# Patient Record
Sex: Female | Born: 1985 | Race: White | Hispanic: No | Marital: Married | Smoking: Current every day smoker
Health system: Southern US, Community
[De-identification: ages and names within clinical notes are randomized; demographics above are authoritative.]

## PROBLEM LIST (undated history)

## (undated) DIAGNOSIS — E785 Hyperlipidemia, unspecified: Secondary | ICD-10-CM

## (undated) DIAGNOSIS — I517 Cardiomegaly: Secondary | ICD-10-CM

## (undated) DIAGNOSIS — R000 Tachycardia, unspecified: Secondary | ICD-10-CM

## (undated) DIAGNOSIS — I493 Ventricular premature depolarization: Secondary | ICD-10-CM

## (undated) HISTORY — DX: Ventricular premature depolarization: I49.3

## (undated) HISTORY — PX: WISDOM TOOTH EXTRACTION: SHX21

## (undated) HISTORY — PX: HERNIA REPAIR: SHX51

## (undated) HISTORY — PX: TONSILLECTOMY: SUR1361

---
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
## 2005-02-16 ENCOUNTER — Ambulatory Visit (HOSPITAL_COMMUNITY): Admission: RE | Admit: 2005-02-16 | Discharge: 2005-02-16 | Payer: Self-pay | Admitting: Obstetrics and Gynecology

## 2006-05-07 ENCOUNTER — Emergency Department (HOSPITAL_COMMUNITY): Admission: EM | Admit: 2006-05-07 | Discharge: 2006-05-07 | Payer: Self-pay | Admitting: Emergency Medicine

## 2014-07-12 ENCOUNTER — Emergency Department (HOSPITAL_COMMUNITY)
Admission: EM | Admit: 2014-07-12 | Discharge: 2014-07-12 | Disposition: A | Discharge status: Home / Self Care | Payer: No Typology Code available for payment source | Attending: Emergency Medicine | Admitting: Emergency Medicine

## 2014-07-12 ENCOUNTER — Encounter (HOSPITAL_COMMUNITY): Payer: Self-pay | Admitting: *Deleted

## 2014-07-12 DIAGNOSIS — Z792 Long term (current) use of antibiotics: Secondary | ICD-10-CM | POA: Insufficient documentation

## 2014-07-12 DIAGNOSIS — Z720 Tobacco use: Secondary | ICD-10-CM | POA: Insufficient documentation

## 2014-07-12 DIAGNOSIS — Z880 Allergy status to penicillin: Secondary | ICD-10-CM | POA: Diagnosis not present

## 2014-07-12 DIAGNOSIS — Z79899 Other long term (current) drug therapy: Secondary | ICD-10-CM | POA: Insufficient documentation

## 2014-07-12 DIAGNOSIS — K047 Periapical abscess without sinus: Secondary | ICD-10-CM

## 2014-07-12 DIAGNOSIS — K029 Dental caries, unspecified: Secondary | ICD-10-CM | POA: Diagnosis not present

## 2014-07-12 DIAGNOSIS — Z8679 Personal history of other diseases of the circulatory system: Secondary | ICD-10-CM | POA: Insufficient documentation

## 2014-07-12 DIAGNOSIS — K088 Other specified disorders of teeth and supporting structures: Secondary | ICD-10-CM | POA: Diagnosis present

## 2014-07-12 DIAGNOSIS — E785 Hyperlipidemia, unspecified: Secondary | ICD-10-CM | POA: Insufficient documentation

## 2014-07-12 HISTORY — DX: Cardiomegaly: I51.7

## 2014-07-12 HISTORY — DX: Hyperlipidemia, unspecified: E78.5

## 2014-07-12 HISTORY — DX: Tachycardia, unspecified: R00.0

## 2014-07-12 MED ORDER — IBUPROFEN 800 MG PO TABS
800.0000 mg | ORAL_TABLET | Freq: Once | ORAL | Status: AC
  Administered 2014-07-12: 800 mg via ORAL
  Filled 2014-07-12: qty 1

## 2014-07-12 MED ORDER — CLINDAMYCIN PHOSPHATE 600 MG/50ML IV SOLN
600.0000 mg | Freq: Once | INTRAVENOUS | Status: AC
  Administered 2014-07-12: 600 mg via INTRAVENOUS
  Filled 2014-07-12: qty 50

## 2014-07-12 NOTE — ED Notes (Signed)
Pt reports being treated on clindamycin, woke this morning with right side of face swollen

## 2014-07-12 NOTE — Discharge Instructions (Signed)

## 2014-07-12 NOTE — ED Provider Notes (Signed)
CSN: [id number]     Arrival date & time 07/12/14  0551 History   First MD Initiated Contact with Patient 07/12/14 [phone]     Chief Complaint  Patient presents with  . Dental Pain    Patient is a 29 y.o. female presenting with tooth pain. The history is provided by the patient.  Dental Pain Location:  Lower Severity:  Moderate Onset quality:  Gradual Duration:  3 days Timing:  Constant Progression:  Worsening Chronicity:  New Relieved by:  Nothing Worsened by:  Jaw movement and pressure Associated symptoms: facial pain and facial swelling   Associated symptoms: no difficulty swallowing, no fever and no neck pain   Risk factors: no diabetes   Pt saw dentist about 3 days ago for dental pain She was given clindamycin  QID.   This morning she woke up with worsened dental pain and facial swelling She reports h/o this previously and has required IV clindamycin She has f/u oral surgeon later this month   Past Medical History  Diagnosis Date  . Tachycardia   . Enlarged heart   . Hyperlipemia    Past Surgical History  Procedure Laterality Date  . Tonsillectomy    . Hernia repair    . Wisdom tooth extraction     No family history on file. History  Substance Use Topics  . Smoking status: Current Every Day Smoker    Types: Cigarettes  . Smokeless tobacco: Not on file  . Alcohol Use: No   OB History    No data available     Review of Systems  Constitutional: Negative for fever.  HENT: Positive for facial swelling. Negative for trouble swallowing.   Cardiovascular: Negative for chest pain.  Gastrointestinal: Negative for vomiting.  Musculoskeletal: Negative for neck pain.  Neurological: Negative for weakness.  All other systems reviewed and are negative.     Allergies  Penicillins  Home Medications   Prior to Admission medications   Medication Sig Start Date End Date Taking? Authorizing Provider  atorvastatin (LIPITOR) 20 MG tablet Take 20 mg by mouth daily.    Yes Historical Provider, MD  clindamycin (CLEOCIN) 300 MG capsule Take 300 mg by mouth 4 (four) times daily.   Yes Historical Provider, MD  norethindrone-ethinyl estradiol (JUNEL FE,GILDESS FE,LOESTRIN FE) 1-20 MG-MCG tablet Take 1 tablet by mouth daily.   Yes Historical Provider, MD   BP 136/96 mmHg  Pulse 93  Temp(Src) 98.2 F (36.8 C) (Oral)  Resp 20  Ht  (1.6 m)  Wt 199 lb (90.266 kg)  BMI 35.26 kg/m2  SpO2 100%  LMP 07/05/2014 Physical Exam CONSTITUTIONAL: Well developed/well nourished HEAD: Normocephalic/atraumatic EYES: EOMI ENMT: Mucous membranes moist. Mild swelling to right lower face.  No signs of cellulitis or abscess.  She has tenderness to right lower gingiva but no focal abscess.  Poor dentition is noted. No stridor.  No drooling.  Normal phonation NECK: supple no meningeal signs SPINE/BACK:entire spine nontender CV: S1/S2 noted, no murmurs/rubs/gallops noted LUNGS:  no apparent distress ABDOMEN: soft, nontender, no rebound or guarding, bowel sounds noted throughout abdomen NEURO: Pt is awake/alert/appropriate, moves all extremitiesx4.     EXTREMITIES: pulses normal/equal, full ROM SKIN: warm, color normal    ED Course  Procedures    Medications  clindamycin (CLEOCIN) IVPB 600 mg (600 mg Intravenous New Bag/Given 07/12/14 0635)  ibuprofen (ADVIL,MOTRIN) tablet 800 mg (800 mg Oral Given 07/12/14 0629)   6:40 AM Pt well appearing.  Suspect early dental abscess.  She is nontoxic I don't feel emergent imaging required She reports when this happened previously it responded to IV clinda (she has severe PCN allergy) She has f/u arranged  Will give IV clindamycin here then d/c home  she understands need for definitive management by dental extraction MDM   Final diagnoses:  Dental abscess    Nursing notes including past medical history and social history reviewed and considered in documentation     Zadie Rhineonald Deverick Pruss, MD 07/12/14 [phone]

## 2016-06-16 ENCOUNTER — Emergency Department (HOSPITAL_COMMUNITY)
Admission: EM | Admit: 2016-06-16 | Discharge: 2016-06-16 | Disposition: A | Discharge status: Home / Self Care | Payer: 59 | Attending: Emergency Medicine | Admitting: Emergency Medicine

## 2016-06-16 ENCOUNTER — Encounter (HOSPITAL_COMMUNITY): Payer: Self-pay | Admitting: Emergency Medicine

## 2016-06-16 ENCOUNTER — Emergency Department (HOSPITAL_COMMUNITY): Payer: 59

## 2016-06-16 DIAGNOSIS — R42 Dizziness and giddiness: Secondary | ICD-10-CM | POA: Diagnosis not present

## 2016-06-16 DIAGNOSIS — R002 Palpitations: Secondary | ICD-10-CM | POA: Insufficient documentation

## 2016-06-16 DIAGNOSIS — R0602 Shortness of breath: Secondary | ICD-10-CM | POA: Insufficient documentation

## 2016-06-16 DIAGNOSIS — F17210 Nicotine dependence, cigarettes, uncomplicated: Secondary | ICD-10-CM | POA: Diagnosis not present

## 2016-06-16 LAB — CBC WITH DIFFERENTIAL/PLATELET
Basophils Absolute: 0.1 10*3/uL (ref 0.0–0.1)
Basophils Relative: 1 %
EOS PCT: 1 %
Eosinophils Absolute: 0.1 10*3/uL (ref 0.0–0.7)
HEMATOCRIT: 40 % (ref 36.0–46.0)
Hemoglobin: 14.4 g/dL (ref 12.0–15.0)
LYMPHS ABS: 3.3 10*3/uL (ref 0.7–4.0)
LYMPHS PCT: 26 %
MCH: 31.5 pg (ref 26.0–34.0)
MCHC: 36 g/dL (ref 30.0–36.0)
MCV: 87.5 fL (ref 78.0–100.0)
MONO ABS: 1 10*3/uL (ref 0.1–1.0)
Monocytes Relative: 8 %
Neutro Abs: 8 10*3/uL — ABNORMAL HIGH (ref 1.7–7.7)
Neutrophils Relative %: 64 %
PLATELETS: 394 10*3/uL (ref 150–400)
RBC: 4.57 MIL/uL (ref 3.87–5.11)
RDW: 12.6 % (ref 11.5–15.5)
WBC: 12.5 10*3/uL — AB (ref 4.0–10.5)

## 2016-06-16 LAB — COMPREHENSIVE METABOLIC PANEL
ALT: 17 U/L (ref 14–54)
AST: 21 U/L (ref 15–41)
Albumin: 4.1 g/dL (ref 3.5–5.0)
Alkaline Phosphatase: 45 U/L (ref 38–126)
Anion gap: 8 (ref 5–15)
BUN: 15 mg/dL (ref 6–20)
CHLORIDE: 104 mmol/L (ref 101–111)
CO2: 24 mmol/L (ref 22–32)
CREATININE: 0.78 mg/dL (ref 0.44–1.00)
Calcium: 9.1 mg/dL (ref 8.9–10.3)
Glucose, Bld: 116 mg/dL — ABNORMAL HIGH (ref 65–99)
POTASSIUM: 3.7 mmol/L (ref 3.5–5.1)
Sodium: 136 mmol/L (ref 135–145)
TOTAL PROTEIN: 7 g/dL (ref 6.5–8.1)
Total Bilirubin: 0.6 mg/dL (ref 0.3–1.2)

## 2016-06-16 LAB — TROPONIN I

## 2016-06-16 LAB — I-STAT BETA HCG BLOOD, ED (MC, WL, AP ONLY): I-stat hCG, quantitative: <5 m[IU]/mL (ref <5)

## 2016-06-16 LAB — D-DIMER, QUANTITATIVE (NOT AT ARMC)

## 2016-06-16 MED ORDER — ATENOLOL 25 MG PO TABS: ORAL_TABLET | ORAL | 0 refills | Status: DC

## 2016-06-16 MED ORDER — ATENOLOL 25 MG PO TABS
25.0000 mg | ORAL_TABLET | Freq: Once | ORAL | Status: AC
  Administered 2016-06-16: 25 mg via ORAL
  Filled 2016-06-16: qty 1

## 2016-06-16 NOTE — ED Triage Notes (Signed)
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  Dr Legal is cardiologist

## 2016-06-16 NOTE — Discharge Instructions (Signed)
Drink pain. Take 1 pill of the atenolol daily if palpitations continue. Follow-up with her doctor this week if needed

## 2016-06-16 NOTE — ED Provider Notes (Signed)
[facility]-EMERGENCY DEPT Provider Note   CSN: [id number] Arrival date & time: 06/16/16  2011   By signing my name below, I, Bobbie Stack, attest that this documentation has been prepared under the direction and in the presence of Bethann Berkshire, MD. Electronically Signed: Bobbie Stack, Scribe. 06/16/16. 8:42 PM. History   Chief Complaint Chief Complaint  Patient presents with  . Palpitations    x 3 days after cortisone injection    HPI Comments: Beth Herrera is a 31 y.o. female who presents to the Emergency Department complaining of palpitations for the past 3 days. The patient states that she had a cortisone injection 3 days ago for biceps tendonitis. The patient states that she began to have PVCs shortly after having the injection. The patient states that this typically occurs after having this injection but it has never lasted this long before. Today after the patient took a shower she began to feel lightheaded. She also reports difficulty breathing. The patient states that she was unable to "catch her breath" which is still present. She denies any pain. She denies hx of beta blockers. She states that she feels much better currently.  The history is provided by the patient. No language interpreter was used.  Palpitations   This is a new problem. The current episode started more than 2 days ago. The problem occurs constantly. The problem has been gradually worsening. The problem is associated with caffeine. Associated symptoms include irregular heartbeat and shortness of breath. Pertinent negatives include no chest pain, no abdominal pain, no headaches, no back pain and no cough. She has tried nothing for the symptoms.   Past Medical History:  Diagnosis Date  . Enlarged heart   . Hyperlipemia   . Tachycardia     There are no active problems to display for this patient.   Past Surgical History:  Procedure Laterality Date  . HERNIA REPAIR    . TONSILLECTOMY    . WISDOM  TOOTH EXTRACTION      OB History    No data available       Home Medications    Prior to Admission medications   Medication Sig Start Date End Date Taking? Authorizing Provider  atorvastatin (LIPITOR) 20 MG tablet Take 20 mg by mouth daily.    Historical Provider, MD  clindamycin (CLEOCIN) 300 MG capsule Take 300 mg by mouth 4 (four) times daily.    Historical Provider, MD  norethindrone-ethinyl estradiol (JUNEL FE,GILDESS FE,LOESTRIN FE) 1-20 MG-MCG tablet Take 1 tablet by mouth daily.    Historical Provider, MD    Family History No family history on file.  Social History Social History  Substance Use Topics  . Smoking status: Current Every Day Smoker    Packs/day: 0.50    Types: Cigarettes  . Smokeless tobacco: Never Used  . Alcohol use No     Allergies   Penicillins   Review of Systems Review of Systems  Constitutional: Negative for appetite change and fatigue.  HENT: Negative for congestion, ear discharge and sinus pressure.   Eyes: Negative for discharge.  Respiratory: Positive for shortness of breath. Negative for cough.   Cardiovascular: Positive for palpitations. Negative for chest pain.  Gastrointestinal: Negative for abdominal pain and diarrhea.  Genitourinary: Negative for frequency and hematuria.  Musculoskeletal: Negative for back pain.  Skin: Negative for rash.  Neurological: Positive for light-headedness. Negative for seizures and headaches.  Psychiatric/Behavioral: Negative for hallucinations.  All other systems reviewed and are negative.    Physical  Exam Updated Vital Signs BP (!) 150/93 (BP Location: Left Arm)   Pulse 87   Temp 97.9 F (36.6 C) (Oral)   Resp 18   Ht 5\' 3"  (1.6 m)   Wt 200 lb (90.7 kg)   LMP 05/17/2016   SpO2 100%   BMI 35.43 kg/m   Physical Exam  Constitutional: She is oriented to person, place, and time. She appears well-developed.  HENT:  Head: Normocephalic.  Eyes: Conjunctivae and EOM are normal. No scleral  icterus.  Neck: Neck supple. No thyromegaly present.  Cardiovascular: Normal rate.  An irregular rhythm present. Exam reveals no gallop and no friction rub.   No murmur heard. Pulmonary/Chest: No stridor. She has no wheezes. She has no rales. She exhibits no tenderness.  Abdominal: She exhibits no distension. There is no tenderness. There is no rebound.  Musculoskeletal: Normal range of motion. She exhibits no edema.  Lymphadenopathy:    She has no cervical adenopathy.  Neurological: She is oriented to person, place, and time. She exhibits normal muscle tone. Coordination normal.  Skin: No rash noted. No erythema.  Psychiatric: She has a normal mood and affect. Her behavior is normal.  Nursing note and vitals reviewed.    ED Treatments / Results  DIAGNOSTIC STUDIES: Oxygen Saturation is 100% on RA, normal by my interpretation.    COORDINATION OF CARE: 8:35 PM Discussed treatment plan with pt at bedside and pt agreed to plan. I will check the patients EKG and labs.  Labs (all labs ordered are listed, but only abnormal results are displayed) Labs Reviewed  CBC WITH DIFFERENTIAL/PLATELET  COMPREHENSIVE METABOLIC PANEL  TROPONIN I  D-DIMER, QUANTITATIVE (NOT AT [facility])  I-STAT BETA HCG BLOOD, ED ([facility], [facility], [facility] ONLY)    EKG  EKG Interpretation None       Radiology No results found.  Procedures Procedures (including critical care time)  Medications Ordered in ED Medications - No data to display   Initial Impression / Assessment and Plan / ED Course  I have reviewed the triage vital signs and the nursing notes.  Pertinent labs & imaging results that were available during my care of the patient were reviewed by me and considered in my medical decision making (see chart for details).    Patient with unifocal PVCs.. The PVCs are infrequent. They do seem to bother the patient. Labs unremarkable. Patient given some atenolol to take if needed for PVCs she will follow-up her  PCP  Final Clinical Impressions(s) / ED Diagnoses   Final diagnoses:  None    New Prescriptions New Prescriptions   No medications on file   The chart was scribed for me under my direct supervision.  I personally performed the history, physical, and medical decision making and all procedures in the evaluation of this patient.Bethann Berkshire.    Theron Cumbie, MD 06/16/16 2230

## 2016-06-24 NOTE — Progress Notes (Deleted)
Cardiology Office Note   Date:  06/24/2016   ID:  OTA EBERSOLE, DOB 1985/06/03, MRN [id number]  PCP:  Truddie Coco, FNP  Cardiologist:   Charlton Haws, MD   No chief complaint on file.     History of Present Illness: Beth Herrera is a 31 y.o. female who presents for evaluation of palpitations Referred by Dr Estell Harpin seen in ER 06/16/16  This coincided with cortisone injection of biceps tendonitis. She has had palpitations after injections before but would go away and these were lasting 3 days. Took shower 3/24 and felt dizzy. Also some dyspnea In ER Noted to have unifocal PVCls in ER given atenolol with some improvement  Labs from ER visit reviewed r/o d dimer negative no TSH done K 3.7 Hct 40  CXR  NAD    Past Medical History:  Diagnosis Date  . Enlarged heart   . Hyperlipemia   . Tachycardia     Past Surgical History:  Procedure Laterality Date  . HERNIA REPAIR    . TONSILLECTOMY    . WISDOM TOOTH EXTRACTION       Current Outpatient Prescriptions  Medication Sig Dispense Refill  . atenolol (TENORMIN) 25 MG tablet Take one pill a day if needed for palpitaions 15 tablet 0  . atorvastatin (LIPITOR) 20 MG tablet Take 20 mg by mouth daily.    . clindamycin (CLEOCIN) 300 MG capsule Take 300 mg by mouth 4 (four) times daily.    . norethindrone-ethinyl estradiol (JUNEL FE,GILDESS FE,LOESTRIN FE) 1-20 MG-MCG tablet Take 1 tablet by mouth daily.     No current facility-administered medications for this visit.     Allergies:   Penicillins    Social History:  The patient  reports that she has been smoking Cigarettes.  She has been smoking about 0.50 packs per day. She has never used smokeless tobacco. She reports that she does not drink alcohol or use drugs.   Family History:  The patient's family history is not on file.    ROS:  Please see the history of present illness.   Otherwise, review of systems are positive for none.   All other systems are reviewed and  negative.    PHYSICAL EXAM: VS:  There were no vitals taken for this visit. , BMI There is no height or weight on file to calculate BMI. Affect appropriate Healthy:  appears stated age HEENT: normal Neck supple with no adenopathy JVP normal no bruits no thyromegaly Lungs clear with no wheezing and good diaphragmatic motion Heart:  S1/S2 no murmur, no rub, gallop or click PMI normal Abdomen: benighn, BS positve, no tenderness, no AAA no bruit.  No HSM or HJR Distal pulses intact with no bruits No edema Neuro non-focal Skin warm and dry No muscular weakness    EKG:  SR LAE normal 06/18/16    Recent Labs: 06/16/2016: ALT 17; BUN 15; Creatinine, Ser 0.78; Hemoglobin 14.4; Platelets 394; Potassium 3.7; Sodium 136    Lipid Panel No results found for: CHOL, TRIG, HDL, CHOLHDL, VLDL, LDLCALC, LDLDIRECT    Wt Readings from Last 3 Encounters:  06/16/16 200 lb (90.7 kg)  07/12/14 199 lb (90.3 kg)      Other studies Reviewed: Additional studies/ records that were reviewed today include: Notes from ER , labs ECG.    ASSESSMENT AND PLAN:  1.  Palpitations/PVC;s : 2.  Cholesterol:     Current medicines are reviewed at length with the patient today.  The patient does  not have concerns regarding medicines.  The following changes have been made:  ***  Labs/ tests ordered today include: Exercise Treadmill and Echo   No orders of the defined types were placed in this encounter.    Disposition:   FU with me after testing      Signed, Charlton Haws, MD  06/24/2016 2:10 PM    [facility] [address] [place], [place], [place]  [address] Phone: [phone]; Fax: [phone]

## 2016-06-25 ENCOUNTER — Encounter: Payer: PRIVATE HEALTH INSURANCE | Admitting: Cardiovascular Disease

## 2018-08-08 ENCOUNTER — Other Ambulatory Visit: Payer: Self-pay

## 2018-08-08 ENCOUNTER — Encounter (HOSPITAL_COMMUNITY): Payer: Self-pay | Admitting: *Deleted

## 2018-08-08 ENCOUNTER — Emergency Department (HOSPITAL_COMMUNITY)
Admission: EM | Admit: 2018-08-08 | Discharge: 2018-08-08 | Disposition: A | Discharge status: Home / Self Care | Payer: No Typology Code available for payment source | Attending: Emergency Medicine | Admitting: Emergency Medicine

## 2018-08-08 ENCOUNTER — Emergency Department (HOSPITAL_COMMUNITY): Payer: No Typology Code available for payment source

## 2018-08-08 DIAGNOSIS — S92034A Nondisplaced avulsion fracture of tuberosity of right calcaneus, initial encounter for closed fracture: Secondary | ICD-10-CM | POA: Insufficient documentation

## 2018-08-08 DIAGNOSIS — W010XXA Fall on same level from slipping, tripping and stumbling without subsequent striking against object, initial encounter: Secondary | ICD-10-CM | POA: Diagnosis not present

## 2018-08-08 DIAGNOSIS — Y999 Unspecified external cause status: Secondary | ICD-10-CM | POA: Diagnosis not present

## 2018-08-08 DIAGNOSIS — F17210 Nicotine dependence, cigarettes, uncomplicated: Secondary | ICD-10-CM | POA: Diagnosis not present

## 2018-08-08 DIAGNOSIS — Z79899 Other long term (current) drug therapy: Secondary | ICD-10-CM | POA: Diagnosis not present

## 2018-08-08 DIAGNOSIS — S99921A Unspecified injury of right foot, initial encounter: Secondary | ICD-10-CM | POA: Diagnosis present

## 2018-08-08 DIAGNOSIS — Y929 Unspecified place or not applicable: Secondary | ICD-10-CM | POA: Diagnosis not present

## 2018-08-08 DIAGNOSIS — Y939 Activity, unspecified: Secondary | ICD-10-CM | POA: Diagnosis not present

## 2018-08-08 NOTE — ED Provider Notes (Signed)
[facility] EMERGENCY DEPARTMENT Provider Note   CSN: [id number] Arrival date & time: 08/08/18  1900    History   Chief Complaint Chief Complaint  Patient presents with  . Fall    HPI Beth Herrera is a 33 y.o. female.     The history is provided by the patient. No language interpreter was used.  Fall  This is a new problem. The current episode started 3 to 5 hours ago. The problem occurs constantly. The problem has been gradually worsening. Nothing aggravates the symptoms. Nothing relieves the symptoms. She has tried nothing for the symptoms. The treatment provided no relief.  Pt reports she took a step and her foot was asleep.  Pt reports she tripped and fell.  Pt complains of pain in her foot and ankle.   Past Medical History:  Diagnosis Date  . Enlarged heart   . Hyperlipemia   . Tachycardia     There are no active problems to display for this patient.   Past Surgical History:  Procedure Laterality Date  . HERNIA REPAIR    . TONSILLECTOMY    . WISDOM TOOTH EXTRACTION       OB History   No obstetric history on file.      Home Medications    Prior to Admission medications   Medication Sig Start Date End Date Taking? Authorizing Provider  atenolol (TENORMIN) 25 MG tablet Take one pill a day if needed for palpitaions 06/16/16   Bethann BerkshireZammit, Joseph, MD  atorvastatin (LIPITOR) 20 MG tablet Take 20 mg by mouth daily.    [provider]  clindamycin (CLEOCIN) 300 MG capsule Take 300 mg by mouth 4 (four) times daily.    [provider]  norethindrone-ethinyl estradiol (JUNEL FE,GILDESS FE,LOESTRIN FE) 1-20 MG-MCG tablet Take 1 tablet by mouth daily.    [provider]    Family History No family history on file.  Social History Social History   Tobacco Use  . Smoking status: Current Every Day Smoker    Packs/day: 0.50    Types: Cigarettes  . Smokeless tobacco: Never Used  Substance Use Topics  . Alcohol use: No  . Drug use: No      Allergies   Penicillins   Review of Systems Review of Systems  Musculoskeletal: Positive for arthralgias.  All other systems reviewed and are negative.    Physical Exam Updated Vital Signs BP 137/76   Pulse 91   Temp 98.1 F (36.7 C)   Resp 16   Ht 5\' 3"  (1.6 m)   Wt 104.3 kg   LMP 08/07/2018   SpO2 100%   BMI 40.74 kg/m   Physical Exam Vitals signs reviewed.  HENT:     Head: Normocephalic.  Musculoskeletal:        General: Swelling, tenderness and signs of injury present.  Skin:    General: Skin is warm.  Neurological:     General: No focal deficit present.     Mental Status: She is alert.  Psychiatric:        Mood and Affect: Mood normal.      ED Treatments / Results  Labs (all labs ordered are listed, but only abnormal results are displayed) Labs Reviewed - No data to display  EKG None  Radiology Dg Ankle Complete Right  Result Date: 08/08/2018 CLINICAL DATA:  Tripped over dog leading to fall. Right foot and ankle pain. EXAM: RIGHT ANKLE - COMPLETE 3+ VIEW COMPARISON:  None. FINDINGS: Acute avulsion  fracture from the anterolateral process of the calcaneus. Associated soft tissue edema. No additional fracture. The ankle mortise is preserved. There is a plantar calcaneal spur. Soft tissue edema most prominent laterally. No tibial talar joint effusion. IMPRESSION: Acute avulsion fracture from the anterolateral process of the calcaneus. Electronically Signed   By: Narda Rutherford M.D.   On: 08/08/2018 20:17   Dg Foot Complete Right  Result Date: 08/08/2018 CLINICAL DATA:  Tripped over dog leading to fall. Right foot and ankle pain. EXAM: RIGHT FOOT COMPLETE - 3+ VIEW COMPARISON:  None. FINDINGS: Acute avulsion fracture from the anterolateral process of the calcaneus. No additional fracture of the foot. The remaining alignment and joint spaces are maintained. Prominent plantar calcaneal spur. Lateral soft tissue edema. IMPRESSION: Acute avulsion  fracture from the anterolateral process of the calcaneus. Electronically Signed   By: Narda Rutherford M.D.   On: 08/08/2018 20:18    Procedures Procedures (including critical care time)  Medications Ordered in ED Medications - No data to display   Initial Impression / Assessment and Plan / ED Course  I have reviewed the triage vital signs and the nursing notes.  Pertinent labs & imaging results that were available during my care of the patient were reviewed by me and considered in my medical decision making (see chart for details).        MDM  Pt has an avulsion fracture to calcaneous.  Pt placed in a cam walker and given crutches.  Pt advised to follow up with Dr. Romeo Apple for recheck.    Final Clinical Impressions(s) / ED Diagnoses   Final diagnoses:  Closed nondisplaced avulsion fracture of tuberosity of right calcaneus, initial encounter    ED Discharge Orders    None    An After Visit Summary was printed and given to the patient.    Osie Cheeks 08/08/18 2039    Gerhard Munch, MD 08/08/18 2257

## 2018-08-08 NOTE — ED Triage Notes (Addendum)
Pt tripped over her dog today, c/o right ankle and foot pain, positive pedal pulse noted, ice pack applied, denies any other injury, denies any LOC<

## 2018-08-08 NOTE — Discharge Instructions (Addendum)
Return if any problems. Schedule to see the Orthopaedist for evaluation next week  

## 2018-11-10 ENCOUNTER — Emergency Department (HOSPITAL_COMMUNITY)
Admission: EM | Admit: 2018-11-10 | Discharge: 2018-11-10 | Disposition: A | Discharge status: Home / Self Care | Payer: No Typology Code available for payment source | Attending: Emergency Medicine | Admitting: Emergency Medicine

## 2018-11-10 ENCOUNTER — Emergency Department (HOSPITAL_COMMUNITY): Payer: No Typology Code available for payment source

## 2018-11-10 ENCOUNTER — Encounter (HOSPITAL_COMMUNITY): Payer: Self-pay | Admitting: Emergency Medicine

## 2018-11-10 ENCOUNTER — Other Ambulatory Visit: Payer: Self-pay

## 2018-11-10 DIAGNOSIS — F17210 Nicotine dependence, cigarettes, uncomplicated: Secondary | ICD-10-CM | POA: Insufficient documentation

## 2018-11-10 DIAGNOSIS — Z79899 Other long term (current) drug therapy: Secondary | ICD-10-CM | POA: Diagnosis not present

## 2018-11-10 DIAGNOSIS — R002 Palpitations: Secondary | ICD-10-CM | POA: Diagnosis not present

## 2018-11-10 LAB — COMPREHENSIVE METABOLIC PANEL
ALT: 20 U/L (ref 0–44)
AST: 17 U/L (ref 15–41)
Albumin: 4.6 g/dL (ref 3.5–5.0)
Alkaline Phosphatase: 67 U/L (ref 38–126)
Anion gap: 11 (ref 5–15)
BUN: 10 mg/dL (ref 6–20)
CO2: 22 mmol/L (ref 22–32)
Calcium: 9.7 mg/dL (ref 8.9–10.3)
Chloride: 103 mmol/L (ref 98–111)
Creatinine, Ser: 0.78 mg/dL (ref 0.44–1.00)
GFR calc Af Amer: >60 mL/min (ref >60)
GFR calc non Af Amer: >60 mL/min (ref >60)
Glucose, Bld: 103 mg/dL — ABNORMAL HIGH (ref 70–99)
Potassium: 3.6 mmol/L (ref 3.5–5.1)
Sodium: 136 mmol/L (ref 135–145)
Total Bilirubin: 0.7 mg/dL (ref 0.3–1.2)
Total Protein: 8 g/dL (ref 6.5–8.1)

## 2018-11-10 LAB — CBC WITH DIFFERENTIAL/PLATELET
Abs Immature Granulocytes: 0.03 10*3/uL (ref 0.00–0.07)
Basophils Absolute: 0.1 10*3/uL (ref 0.0–0.1)
Basophils Relative: 1 %
Eosinophils Absolute: 0.1 10*3/uL (ref 0.0–0.5)
Eosinophils Relative: 2 %
HCT: 46.2 % — ABNORMAL HIGH (ref 36.0–46.0)
Hemoglobin: 15.6 g/dL — ABNORMAL HIGH (ref 12.0–15.0)
Immature Granulocytes: 0 %
Lymphocytes Relative: 31 %
Lymphs Abs: 2.9 10*3/uL (ref 0.7–4.0)
MCH: 29.4 pg (ref 26.0–34.0)
MCHC: 33.8 g/dL (ref 30.0–36.0)
MCV: 87 fL (ref 80.0–100.0)
Monocytes Absolute: 0.7 10*3/uL (ref 0.1–1.0)
Monocytes Relative: 7 %
Neutro Abs: 5.5 10*3/uL (ref 1.7–7.7)
Neutrophils Relative %: 59 %
Platelets: 417 10*3/uL — ABNORMAL HIGH (ref 150–400)
RBC: 5.31 MIL/uL — ABNORMAL HIGH (ref 3.87–5.11)
RDW: 12.9 % (ref 11.5–15.5)
WBC: 9.3 10*3/uL (ref 4.0–10.5)
nRBC: 0 % (ref 0.0–0.2)

## 2018-11-10 LAB — TSH: TSH: 3.582 u[IU]/mL (ref 0.350–4.500)

## 2018-11-10 LAB — LIPASE, BLOOD: Lipase: 46 U/L (ref 11–51)

## 2018-11-10 MED ORDER — METOPROLOL TARTRATE 50 MG PO TABS
50.0000 mg | ORAL_TABLET | Freq: Once | ORAL | Status: AC
  Administered 2018-11-10: 50 mg via ORAL
  Filled 2018-11-10: qty 1

## 2018-11-10 MED ORDER — METOPROLOL TARTRATE 25 MG PO TABS: 25.0000 mg | ORAL_TABLET | Freq: Two times a day (BID) | ORAL | 0 refills | Status: DC | PRN

## 2018-11-10 MED ORDER — LORAZEPAM 1 MG PO TABS
1.0000 mg | ORAL_TABLET | Freq: Once | ORAL | Status: AC
  Administered 2018-11-10: 15:00:00 1 mg via ORAL
  Filled 2018-11-10: qty 1

## 2018-11-10 NOTE — ED Notes (Signed)
EKG given to Dr. Miller.  

## 2018-11-10 NOTE — ED Provider Notes (Signed)
Patient presents for evaluation of palpitations.  Checkout from Dr. Sabra Heck to evaluate after labs complete.  TSH, is normal.  Dr. Ammie Ferrier note reviewed.  She has been treated with Ativan and metoprolol.  The metoprolol was given at 1444.   Patient Vitals for the past 24 hrs:  BP Pulse Resp SpO2 Height Weight  11/10/18 1500 (!) 133/102 (!) 111 (!) 24 100 % - -  11/10/18 1430 (!) 122/92 82 10 99 % - -  11/10/18 1357 - - - - 5\' 3"  (1.6 m) 90.7 kg    3:48 PM Reevaluation with update and discussion. After initial assessment and treatment, an updated evaluation reveals patient is comfortable.  Cardiac monitor indicates heart rate 70 at this time.  She has mild hypertension.  Patient is comfortable and feels like she is ready to go home.  Findings discussed with patient and her husband, all questions were answered. Daleen Bo   Medical Decision Making: Palpitation without worrisome symptoms for PE, cardiac disorder or acute infectious process.  Patient with history of palpitations, previously treated for them with beta-blocker.  She was using it intermittently.  No recent cardiac evaluation.  Patient is clinically stable here in the ED.  She does not require additional evaluation.  She will benefit from follow-up with cardiology for outpatient testing likely to include long-term cardiac monitoring with event monitor.  Doubt acute infectious process, metabolic instability or impending vascular collapse.  CRITICAL CARE-no Performed by: Daleen Bo  Nursing Notes Reviewed/ Care Coordinated Applicable Imaging Reviewed Interpretation of Laboratory Data incorporated into ED treatment  The patient appears reasonably screened and/or stabilized for discharge and I doubt any other medical condition or other [facility] requiring further screening, evaluation, or treatment in the ED at this time prior to discharge.  Plan: Home Medications-start metoprolol 25 mg, twice daily, PRN.; Home Treatments-rest, fluids,  avoid cardiac stimulants.; return here if the recommended treatment, does not improve the symptoms; Recommended follow up-cardiology follow-up 1 to 2 weeks for further testing and treatment.    Daleen Bo, MD 11/10/18 [phone]

## 2018-11-10 NOTE — ED Provider Notes (Signed)
[facility] EMERGENCY DEPARTMENT Provider Note   CSN: [id number] Arrival date & time: 11/10/18  1348     History   Chief Complaint Chief Complaint  Patient presents with  . Chest Pain    HPI Beth Herrera is a 33 y.o. female.     HPI  This patient is a very pleasant 33 year old female who has no significant chronic medical conditions, she does have a history of coming to the hospital because of palpitations and PVCs in the past, she had seen a cardiologist about 5 years ago where she had a transesophageal echocardiogram and a stress test both of which were unremarkable.  There is a family history of atrial myxoma.  She reports that she has had intermittent palpitations over time the usually it is once or twice a day but nothing that serious.  Over the last 2 days she has had a feeling of vague discomfort in her chest which seem to radiate to her back today and was associated with some shortness of breath and palpitations.  At this time on my examination of the patient she states that she has just a subtle feeling in the left upper chest which is very common for her and is not exertional at all.  In fact she had been going to the gym before this coronavirus outbreak and was not having any symptoms like this while she was exercising.  She denies diarrhea, she had a recent urinary tract infection and took Cipro.  She reports running out of her atenolol which she had used as needed for palpitations and was given this a couple of years ago in the emergency department.  She has not had in quite some time.  At this time her symptoms are mild, not associated with fevers, chills, nausea vomiting or diarrhea or swelling of the legs.  She does drink some coffee, unaware of any thyroid dysfunction, she does take Allegra  Past Medical History:  Diagnosis Date  . Enlarged heart   . Hyperlipemia   . Tachycardia     There are no active problems to display for this patient.   Past Surgical  History:  Procedure Laterality Date  . HERNIA REPAIR    . TONSILLECTOMY    . WISDOM TOOTH EXTRACTION       OB History   No obstetric history on file.      Home Medications    Prior to Admission medications   Medication Sig Start Date End Date Taking? Authorizing Provider  atenolol (TENORMIN) 25 MG tablet Take one pill a day if needed for palpitaions 06/16/16   Milton Ferguson, MD  atorvastatin (LIPITOR) 20 MG tablet Take 20 mg by mouth daily.    [provider]  clindamycin (CLEOCIN) 300 MG capsule Take 300 mg by mouth 4 (four) times daily.    [provider]  norethindrone-ethinyl estradiol (JUNEL FE,GILDESS FE,LOESTRIN FE) 1-20 MG-MCG tablet Take 1 tablet by mouth daily.    [provider]    Family History No family history on file.  Social History Social History   Tobacco Use  . Smoking status: Current Every Day Smoker    Packs/day: 0.50    Types: Cigarettes  . Smokeless tobacco: Never Used  Substance Use Topics  . Alcohol use: No  . Drug use: No     Allergies   Penicillins   Review of Systems Review of Systems  All other systems reviewed and are negative.    Physical Exam Updated Vital Signs  Ht 1.6 m (5\' 3" )   Wt 90.7 kg   LMP 10/10/2018   BMI 35.43 kg/m   Physical Exam Vitals signs and nursing note reviewed.  Constitutional:      General: She is not in acute distress.    Appearance: She is well-developed.  HENT:     Head: Normocephalic and atraumatic.     Mouth/Throat:     Pharynx: No oropharyngeal exudate.  Eyes:     General: No scleral icterus.       Right eye: No discharge.        Left eye: No discharge.     Conjunctiva/sclera: Conjunctivae normal.     Pupils: Pupils are equal, round, and reactive to light.  Neck:     Musculoskeletal: Normal range of motion and neck supple.     Thyroid: No thyromegaly.     Vascular: No JVD.  Cardiovascular:     Rate and Rhythm: Normal rate and regular rhythm.   Extrasystoles are present.    Heart sounds: Normal heart sounds. No murmur. No friction rub. No gallop.   Pulmonary:     Effort: Pulmonary effort is normal. No respiratory distress.     Breath sounds: Normal breath sounds. No wheezing or rales.  Abdominal:     General: Bowel sounds are normal. There is no distension.     Palpations: Abdomen is soft. There is no mass.     Tenderness: There is no abdominal tenderness.  Musculoskeletal: Normal range of motion.        General: No tenderness.  Lymphadenopathy:     Cervical: No cervical adenopathy.  Skin:    General: Skin is warm and dry.     Findings: No erythema or rash.  Neurological:     Mental Status: She is alert.     Coordination: Coordination normal.  Psychiatric:        Behavior: Behavior normal.      ED Treatments / Results  Labs (all labs ordered are listed, but only abnormal results are displayed) Labs Reviewed  TSH  CBC WITH DIFFERENTIAL/PLATELET  COMPREHENSIVE METABOLIC PANEL  LIPASE, BLOOD    EKG EKG Interpretation  Date/Time:  Monday November 10 2018 14:07:14 EDT Ventricular Rate:  93 PR Interval:    QRS Duration: 88 QT Interval:  381 QTC Calculation: 393 R Axis:   97 Text Interpretation:  Sinus rhythm Ventricular bigeminy Anterior infarct, old Repol abnrm suggests ischemia, inferior leads Baseline wander in lead(s) I III aVL Since last tracing ectopy now present, otherwise unchanged Confirmed by Eber HongMiller, Aubrey Voong ([id number]) on 11/10/2018 2:10:22 PM   EKG Interpretation  Date/Time:  Monday November 10 2018 14:11:58 EDT Ventricular Rate:  159 PR Interval:    QRS Duration: 82 QT Interval:  303 QTC Calculation: 493 R Axis:   103 Text Interpretation:  Sinus tachycardia Ventricular premature complex Borderline prolonged PR interval Prominent P waves, nondiagnostic Anterolateral infarct, age indeterminate Abnormal T, consider ischemia, inferior leads Baseline wander in lead(s) II III aVF Confirmed by Eber HongMiller, Mauri Tolen  [id number]) on 11/10/2018 2:29:20 PM        EKG Interpretation  Date/Time:  Monday November 10 2018 14:17:48 EDT Ventricular Rate:  107 PR Interval:    QRS Duration: 76 QT Interval:  321 QTC Calculation: 429 R Axis:   106 Text Interpretation:  Sinus tachycardia Consider right atrial enlargement Anterolateral infarct, age indeterminate Since last tracing rate slower Confirmed by Eber HongMiller, Jasey Cortez ([id number]) on 11/10/2018 2:29:49 PM     ]  Radiology No results found.  Procedures Procedures (including critical care time)  Medications Ordered in ED Medications  metoprolol tartrate (LOPRESSOR) tablet 50 mg (has no administration in time range)     Initial Impression / Assessment and Plan / ED Course  I have reviewed the triage vital signs and the nursing notes.  Pertinent labs & imaging results that were available during my care of the patient were reviewed by me and considered in my medical decision making (see chart for details).       While I am examining the patient she goes from ventricular bigeminy back in a normal sinus rhythm.  She does not appear to have any SVT.  Her heart rate is between 90 and 110 and what appears to be sinus tachycardia and normal sinus rhythm.  She is not having any active shortness of breath, she is not coughing, does not feel swollen in the legs however given her increase in heart rate with palpitations which are very palpable for her I will check some labs including a thyroid panel as well as metabolic panel, CBC and an EKG.  The EKG reveals frequent ectopy, the patient then became very tachycardic at 159 and then slowed down to a normal sinus rhythm spontaneously.  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  I have encouraged the patient to stop smoking, she smokes 1 pack of cigarettes per day.  I have also encouraged her to stop her intake  of caffeine or other stimulants and she is agreeable.  Labs pending, the patient is stable appearing and not in need of cardioversion or adenosine at this time.  She will be given a small dose of metoprolol and likely started on this for a small daily dose.  Needs cardiology outpatient follow-up, this does not sound ischemic and she has no other risk factors for ischemia.  Final Clinical Impressions(s) / ED Diagnoses   Final diagnoses:  None    ED Discharge Orders    None       Eber HongMiller, Kristan Votta, MD 11/14/18 [phone]

## 2018-11-10 NOTE — ED Notes (Signed)
Pt has been hooked up to [employer].

## 2018-11-10 NOTE — ED Triage Notes (Signed)
Pt states that she has been having pain in her chest since yesterday it has moved into her back since yesterday.

## 2018-11-10 NOTE — Discharge Instructions (Addendum)
The testing today, is reassuring.  There is no sign of acute heart disorder, infections, thyroid disease or metabolic problems.  It will be helpful for you to have further evaluation by a cardiologist, to check your cardiac rhythm and rate, with a Holter monitor.  Call to schedule appointment.  In the meantime make sure you are getting plenty of rest, drinking a lot of fluids and eating 3 meals each day.

## 2018-11-21 ENCOUNTER — Other Ambulatory Visit: Payer: Self-pay

## 2018-11-21 ENCOUNTER — Ambulatory Visit (INDEPENDENT_AMBULATORY_CARE_PROVIDER_SITE_OTHER): Payer: No Typology Code available for payment source | Admitting: Cardiology

## 2018-11-21 ENCOUNTER — Encounter: Payer: Self-pay | Admitting: Cardiology

## 2018-11-21 ENCOUNTER — Telehealth: Payer: Self-pay | Admitting: Cardiology

## 2018-11-21 VITALS — BP 114/77 | HR 81 | Ht 63.0 in | Wt 226.0 lb

## 2018-11-21 DIAGNOSIS — R002 Palpitations: Secondary | ICD-10-CM

## 2018-11-21 DIAGNOSIS — I493 Ventricular premature depolarization: Secondary | ICD-10-CM

## 2018-11-21 MED ORDER — ATENOLOL 25 MG PO TABS: 25.0000 mg | ORAL_TABLET | Freq: Every day | ORAL | 3 refills | Status: AC | PRN

## 2018-11-21 NOTE — Telephone Encounter (Signed)
Pre-cert Verification for the following procedure    3 Day ZIO dx: frequent PVC's & palpitations

## 2018-11-21 NOTE — Patient Instructions (Addendum)
Medication Instructions:    Your physician has recommended you make the following change in your medication:   Stop metoprolol  Start atenolol 25 mg by mouth daily as needed for palpitations  Continue all other medications the same  Labwork:  NONE  Testing/Procedures: Your physician has requested that you have an echocardiogram. Echocardiography is a painless test that uses sound waves to create images of your heart. It provides your doctor with information about the size and shape of your heart and how well your heart's chambers and valves are working. This procedure takes approximately one hour. There are no restrictions for this procedure. Your physician has recommended that you wear a holter monitor for 3 days. Holter monitors are medical devices that record the heart's electrical activity. Doctors most often use these monitors to diagnose arrhythmias. Arrhythmias are problems with the speed or rhythm of the heartbeat. The monitor is a small, portable device. You can wear one while you do your normal daily activities. This is usually used to diagnose what is causing palpitations/syncope (passing out). [employer] (ZIO) will contact you about this monitor.  Follow-Up:  Your physician recommends that you schedule a follow-up appointment in: 3-4 months.  Any Other Special Instructions Will Be Listed Below (If Applicable).  If you need a refill on your cardiac medications before your next appointment, please call your pharmacy.

## 2018-11-21 NOTE — Progress Notes (Signed)
Cardiology Office Note  Date: 11/21/2018   ID: Beth AbrahamsStephanie M Herrera, DOB 06/14/1985, MRN [id number]  PCP:  Truddie CocoMcClure, Karen, FNP  Consulting Cardiologist:  Nona DellSamuel Rachael Ferrie, MD Electrophysiologist:  None   Chief Complaint  Patient presents with  . Palpitations    History of Present Illness: Beth Herrera is a 33 y.o. female referred for cardiology consultation by Dr. Effie ShyWentz for the evaluation of palpitations.  She was seen in the ER recently on August 17.  She describes a 5-year history of intermittent palpitations and documented PVCs that tend to come and flareups.  She describes prior documented "bigeminy."  She is symptomatic during these times, feels a sense of palpitations, sometimes breathlessness or discomfort in the chest.  She has never had frank syncope.  She may have had an episode of SVT as well, but details are not clear.  She reports a history of atrial myxoma in some family members, she actually had a TEE about 5 years ago that was reportedly normal.  No other known history of structural heart disease.  She was previously on atenolol which she took as needed for PVCs/palpitations.  She was started on metoprolol after her recent ER visit, but took 1 dose and actually felt worse on that medication.  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  She is currently on a diet, trying to lose weight.  She had gained about 50 pounds over the last 5 years.  Not exercising as much during the pandemic but is trying to start back.  She is also cutting back on caffeine.  Past Medical History:  Diagnosis Date  . Hyperlipemia   . PVC's (premature ventricular contractions)     Past Surgical History:  Procedure Laterality Date  . HERNIA REPAIR    . TONSILLECTOMY    . WISDOM TOOTH EXTRACTION      Current Outpatient Medications  Medication Sig Dispense Refill  . fexofenadine (ALLEGRA) 180 MG tablet Take 180 mg by mouth daily as needed for allergies or  rhinitis.    . Multiple Vitamins-Minerals (HAIR SKIN NAILS PO) Take 1 tablet by mouth daily.    Marland Kitchen. atenolol (TENORMIN) 25 MG tablet Take 1 tablet (25 mg total) by mouth daily as needed (palpitations). 30 tablet 3   No current facility-administered medications for this visit.    Allergies:  Penicillins   Social History: The patient  reports that she has been smoking. She has been smoking about 1.00 pack per day. She has never used smokeless tobacco. She reports that she does not drink alcohol or use drugs.   Family History: The patient's family history includes Lung cancer in her mother.   ROS:  Please see the history of present illness. Otherwise, complete review of systems is positive for none.  All other systems are reviewed and negative.   Physical Exam: VS:  BP 114/77   Pulse 81   Ht 5\' 3"  (1.6 m)   Wt 226 lb (102.5 kg)   SpO2 98%   BMI 40.03 kg/m , BMI Body mass index is 40.03 kg/m.  Wt Readings from Last 3 Encounters:  11/21/18 226 lb (102.5 kg)  11/10/18 200 lb (90.7 kg)  08/08/18 230 lb (104.3 kg)    General: Patient appears comfortable at rest. HEENT: Conjunctiva and lids normal, wearing a mask. Neck: Supple, no elevated JVP or carotid bruits, no thyromegaly. Lungs: Clear to auscultation, nonlabored breathing at rest. Cardiac: Regular rate and rhythm, no S3, 2/6  systolic murmur, no pericardial rub. Abdomen: Soft, nontender, bowel sounds present. Extremities: No pitting edema, distal pulses 2+. Skin: Warm and dry. Musculoskeletal: No kyphosis. Neuropsychiatric: Alert and oriented x3, affect grossly appropriate.  ECG:  An ECG dated 11/10/2018 was personally reviewed today and demonstrated:  Sinus rhythm with frequent PVCs, positive in the inferior leads and likely from the outflow tract.  Recent Labwork: 11/10/2018: ALT 20; AST 17; BUN 10; Creatinine, Ser 0.78; Hemoglobin 15.6; Platelets 417; Potassium 3.6; Sodium 136; TSH 3.582   Other Studies Reviewed Today:   Chest x-ray 11/10/2018: FINDINGS: Cardiomediastinal silhouette is within normal limits in size and configuration. Pacer pad overlies the LEFT heart border. Lungs are clear. Lung volumes are normal. No evidence of pneumonia. No pleural effusion. No pneumothorax seen. Osseous structures about the chest are unremarkable.  IMPRESSION: No active disease.  Assessment and Plan:  1.  PVCs, intermittently frequent and symptomatic based on discussion above.  Recent ECG shows that these PVCs are likely originating from the outflow tract.  Plan at this time is to obtain an echocardiogram to ensure normal cardiac structure and function.  We will also obtain a 3-day ZIO patch to better assess relative PVC frequency.  We will take her off metoprolol and resume atenolol for as needed use.  Follow-up arranged.  2.  Lifestyle modification.  She is working on weight loss through diet, also plans to get back to regular exercise.  She is cutting back on caffeine.  Medication Adjustments/Labs and Tests Ordered: Current medicines are reviewed at length with the patient today.  Concerns regarding medicines are outlined above.    Tests Ordered: Orders Placed This Encounter  Procedures  . LONG TERM MONITOR (3-14 DAYS)  . ECHOCARDIOGRAM COMPLETE    Medication Changes: Meds ordered this encounter  Medications  . atenolol (TENORMIN) 25 MG tablet    Sig: Take 1 tablet (25 mg total) by mouth daily as needed (palpitations).    Dispense:  30 tablet    Refill:  3    Disposition:  Follow up 3 to 4 months in the [place] office.  Signed, Satira Sark, MD, [facility] 11/21/2018 10:31 AM    [facility] [address], [place], [place] [address] Phone: [phone]; Fax: [phone]

## 2018-11-26 ENCOUNTER — Other Ambulatory Visit: Payer: Self-pay | Admitting: Cardiology

## 2018-11-26 ENCOUNTER — Ambulatory Visit (INDEPENDENT_AMBULATORY_CARE_PROVIDER_SITE_OTHER): Payer: No Typology Code available for payment source

## 2018-11-26 DIAGNOSIS — I493 Ventricular premature depolarization: Secondary | ICD-10-CM | POA: Diagnosis not present

## 2018-11-26 DIAGNOSIS — R002 Palpitations: Secondary | ICD-10-CM | POA: Diagnosis not present

## 2018-12-17 ENCOUNTER — Other Ambulatory Visit: Payer: Self-pay

## 2018-12-23 ENCOUNTER — Telehealth: Payer: Self-pay | Admitting: *Deleted

## 2018-12-23 NOTE — Telephone Encounter (Signed)
-----   Message from Satira Sark, MD sent at 12/22/2018  9:19 AM EDT ----- Results reviewed.  Please let her know that the cardiac monitor was reassuring.  Sinus rhythm present and there were only rare PACs and PVCs, less than 1% of total beats.  She did have a limited episode of ventricular bigeminy.  There were no sustained arrhythmias.  Continue with current plan, follow-up echocardiogram pending.

## 2018-12-31 ENCOUNTER — Ambulatory Visit (INDEPENDENT_AMBULATORY_CARE_PROVIDER_SITE_OTHER): Payer: Self-pay

## 2018-12-31 ENCOUNTER — Encounter

## 2018-12-31 ENCOUNTER — Other Ambulatory Visit: Payer: Self-pay

## 2018-12-31 DIAGNOSIS — I493 Ventricular premature depolarization: Secondary | ICD-10-CM

## 2018-12-31 NOTE — Telephone Encounter (Signed)
-----   Message from Satira Sark, MD sent at 12/31/2018  2:35 PM EDT ----- Results reviewed.  Cardiac function is normal with LVEF 60 to 65%, also normal RV function.  No major valvular abnormalities.  Overall reassuring in the setting of PVCs.

## 2019-01-02 NOTE — Telephone Encounter (Signed)
Patient informed. Copy sent to PCP °

## 2019-02-27 ENCOUNTER — Ambulatory Visit: Payer: No Typology Code available for payment source | Admitting: Cardiology

## 2019-02-27 NOTE — Progress Notes (Deleted)
Cardiology Office Note  Date: 02/27/2019   ID: Yvonna, Brun March 15, 1986, MRN [id number]  PCP:  Guy Begin, FNP  Cardiologist:  Rozann Lesches, MD Electrophysiologist:  None   No chief complaint on file.   History of Present Illness: Beth Herrera is a 33 y.o. female seen in consultation back in August.  Echocardiogram from October revealed LVEF 60 to 65% with no significant valvular abnormalities.  Cardiac monitor from September showed sinus rhythm with rare PACs and PVCs as well as limited ventricular bigeminy.  Past Medical History:  Diagnosis Date  . Hyperlipemia   . PVC's (premature ventricular contractions)     Past Surgical History:  Procedure Laterality Date  . HERNIA REPAIR    . TONSILLECTOMY    . WISDOM TOOTH EXTRACTION      Current Outpatient Medications  Medication Sig Dispense Refill  . atenolol (TENORMIN) 25 MG tablet Take 1 tablet (25 mg total) by mouth daily as needed (palpitations). 30 tablet 3  . fexofenadine (ALLEGRA) 180 MG tablet Take 180 mg by mouth daily as needed for allergies or rhinitis.    . Multiple Vitamins-Minerals (HAIR SKIN NAILS PO) Take 1 tablet by mouth daily.     No current facility-administered medications for this visit.    Allergies:  Penicillins   Social History: The patient  reports that she has been smoking. She has been smoking about 1.00 pack per day. She has never used smokeless tobacco. She reports that she does not drink alcohol or use drugs.   Family History: The patient's family history includes Lung cancer in her mother.   ROS:  Please see the history of present illness. Otherwise, complete review of systems is positive for {NONE DEFAULTED:18576::"none"}.  All other systems are reviewed and negative.   Physical Exam: VS:  There were no vitals taken for this visit., BMI There is no height or weight on file to calculate BMI.  Wt Readings from Last 3 Encounters:  11/21/18 226 lb (102.5 kg)  11/10/18  200 lb (90.7 kg)  08/08/18 230 lb (104.3 kg)    General: Patient appears comfortable at rest. HEENT: Conjunctiva and lids normal, oropharynx clear with moist mucosa. Neck: Supple, no elevated JVP or carotid bruits, no thyromegaly. Lungs: Clear to auscultation, nonlabored breathing at rest. Cardiac: Regular rate and rhythm, no S3 or significant systolic murmur, no pericardial rub. Abdomen: Soft, nontender, no hepatomegaly, bowel sounds present, no guarding or rebound. Extremities: No pitting edema, distal pulses 2+. Skin: Warm and dry. Musculoskeletal: No kyphosis. Neuropsychiatric: Alert and oriented x3, affect grossly appropriate.  ECG:  An ECG dated 11/10/2018 was personally reviewed today and demonstrated:  Sinus rhythm with frequent PVCs.  Recent Labwork: 11/10/2018: ALT 20; AST 17; BUN 10; Creatinine, Ser 0.78; Hemoglobin 15.6; Platelets 417; Potassium 3.6; Sodium 136; TSH 3.582   Other Studies Reviewed Today:  Echocardiogram 12/31/2018:  1. Left ventricular ejection fraction, by visual estimation, is 60 to 65%. The left ventricle has normal function. Normal left ventricular size. There is no left ventricular hypertrophy.  2. Global right ventricle has normal systolic function.The right ventricular size is normal. No increase in right ventricular wall thickness.  3. Left atrial size was normal.  4. Right atrial size was normal.  5. Trivial pericardial effusion is present.  6. The mitral valve is grossly normal. Trace mitral valve regurgitation.  7. The tricuspid valve is grossly normal. Tricuspid valve regurgitation is trivial.  8. The aortic valve is tricuspid Aortic valve regurgitation  was not visualized by color flow Doppler.  9. The pulmonic valve was grossly normal. Pulmonic valve regurgitation is not visualized by color flow Doppler. 10. Normal pulmonary artery systolic pressure. 11. The tricuspid regurgitant velocity is 2.13 m/s, and with an assumed right atrial pressure of 3  mmHg, the estimated right ventricular systolic pressure is normal at 21.1 mmHg. 12. The inferior vena cava is normal in size with greater than 50% respiratory variability, suggesting right atrial pressure of 3 mmHg.  Zio patch 12/17/2018: 72-hour ZIO Patch monitor reviewed.  Predominant rhythm is sinus with heart rate ranging from 46 bpm up to 155 bpm and average heart rate 83 bpm. There were rare PACs and PVCs, both representing less than 1% of total beats.  Limited ventricular bigeminy noted.  Patient triggered events corresponded to sinus rhythm.  There were no sustained arrhythmias or pauses.  Assessment and Plan:    Medication Adjustments/Labs and Tests Ordered: Current medicines are reviewed at length with the patient today.  Concerns regarding medicines are outlined above.   Tests Ordered: No orders of the defined types were placed in this encounter.   Medication Changes: No orders of the defined types were placed in this encounter.   Disposition:  Follow up {follow up:15908}  Signed, Jonelle Sidle, MD, [facility] 02/27/2019 2:58 PM    [facility] [address] [place], [place], [place] [address] Phone: [phone]; Fax: [phone]

## 2019-12-31 DIAGNOSIS — F330 Major depressive disorder, recurrent, mild: Secondary | ICD-10-CM | POA: Diagnosis not present

## 2020-01-13 DIAGNOSIS — F330 Major depressive disorder, recurrent, mild: Secondary | ICD-10-CM | POA: Diagnosis not present

## 2020-02-08 DIAGNOSIS — F330 Major depressive disorder, recurrent, mild: Secondary | ICD-10-CM | POA: Diagnosis not present

## 2020-02-26 DIAGNOSIS — F330 Major depressive disorder, recurrent, mild: Secondary | ICD-10-CM | POA: Diagnosis not present

## 2020-08-25 IMAGING — CR RIGHT ANKLE - COMPLETE 3+ VIEW
3 series · 3 of 3 positions shown · non-contrast
Comparison: None.

CLINICAL DATA: Tripped over dog leading to fall. Right foot and
ankle pain.

EXAM:
RIGHT ANKLE - COMPLETE 3+ VIEW

[ap]
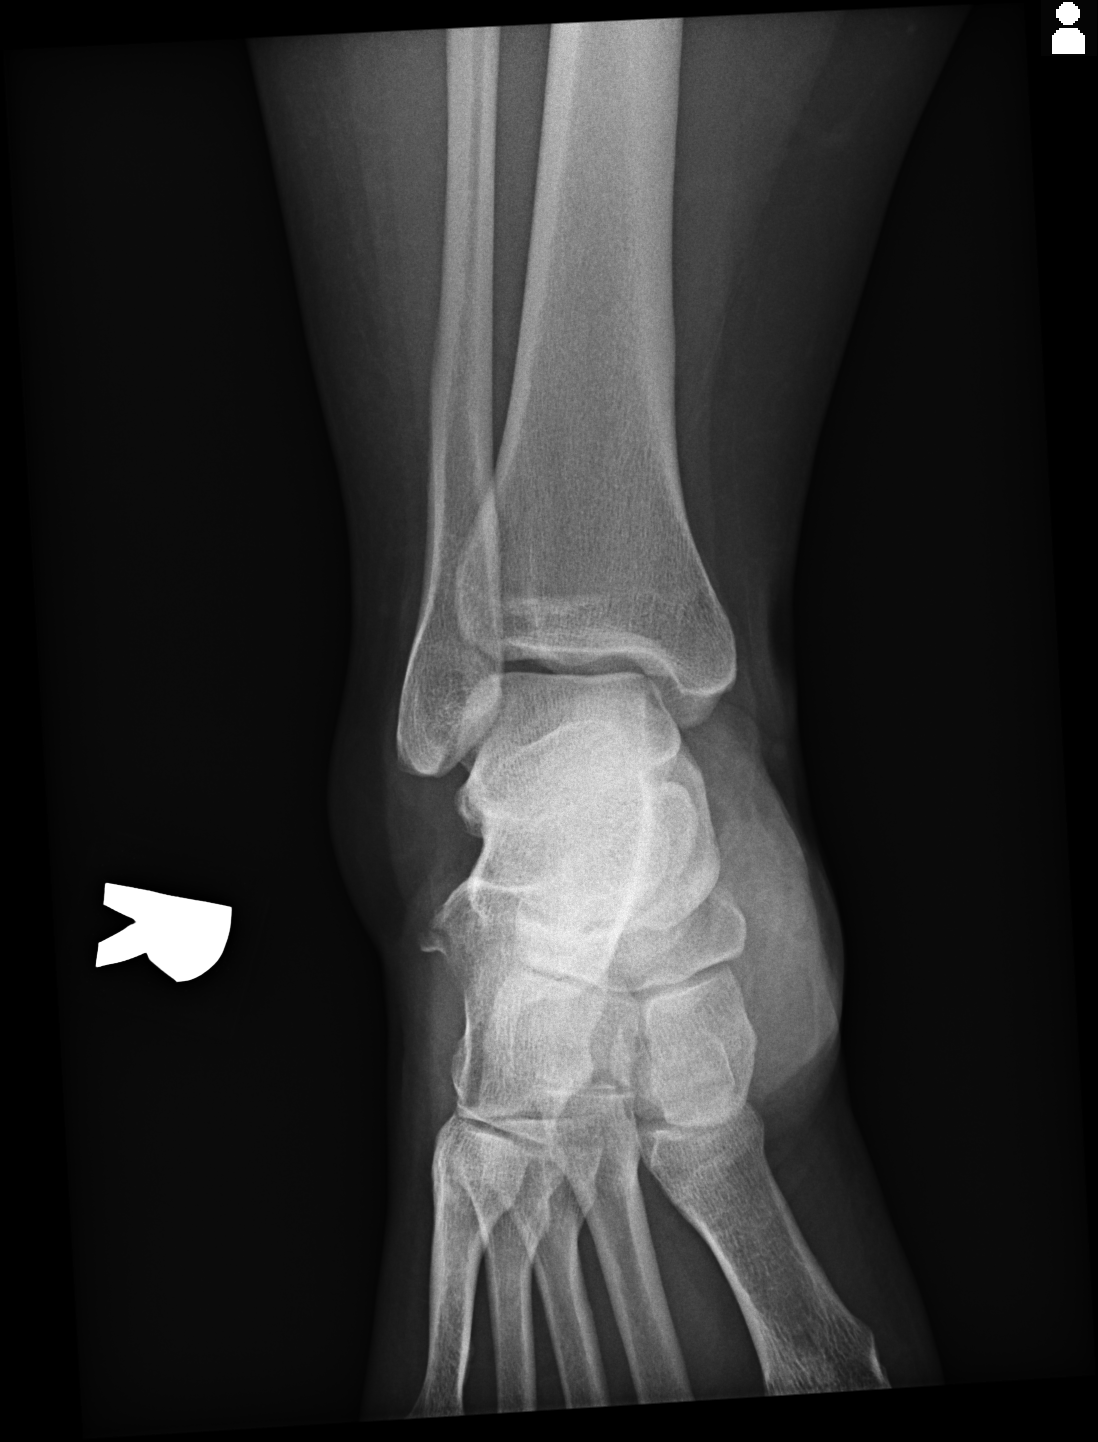

[lat]
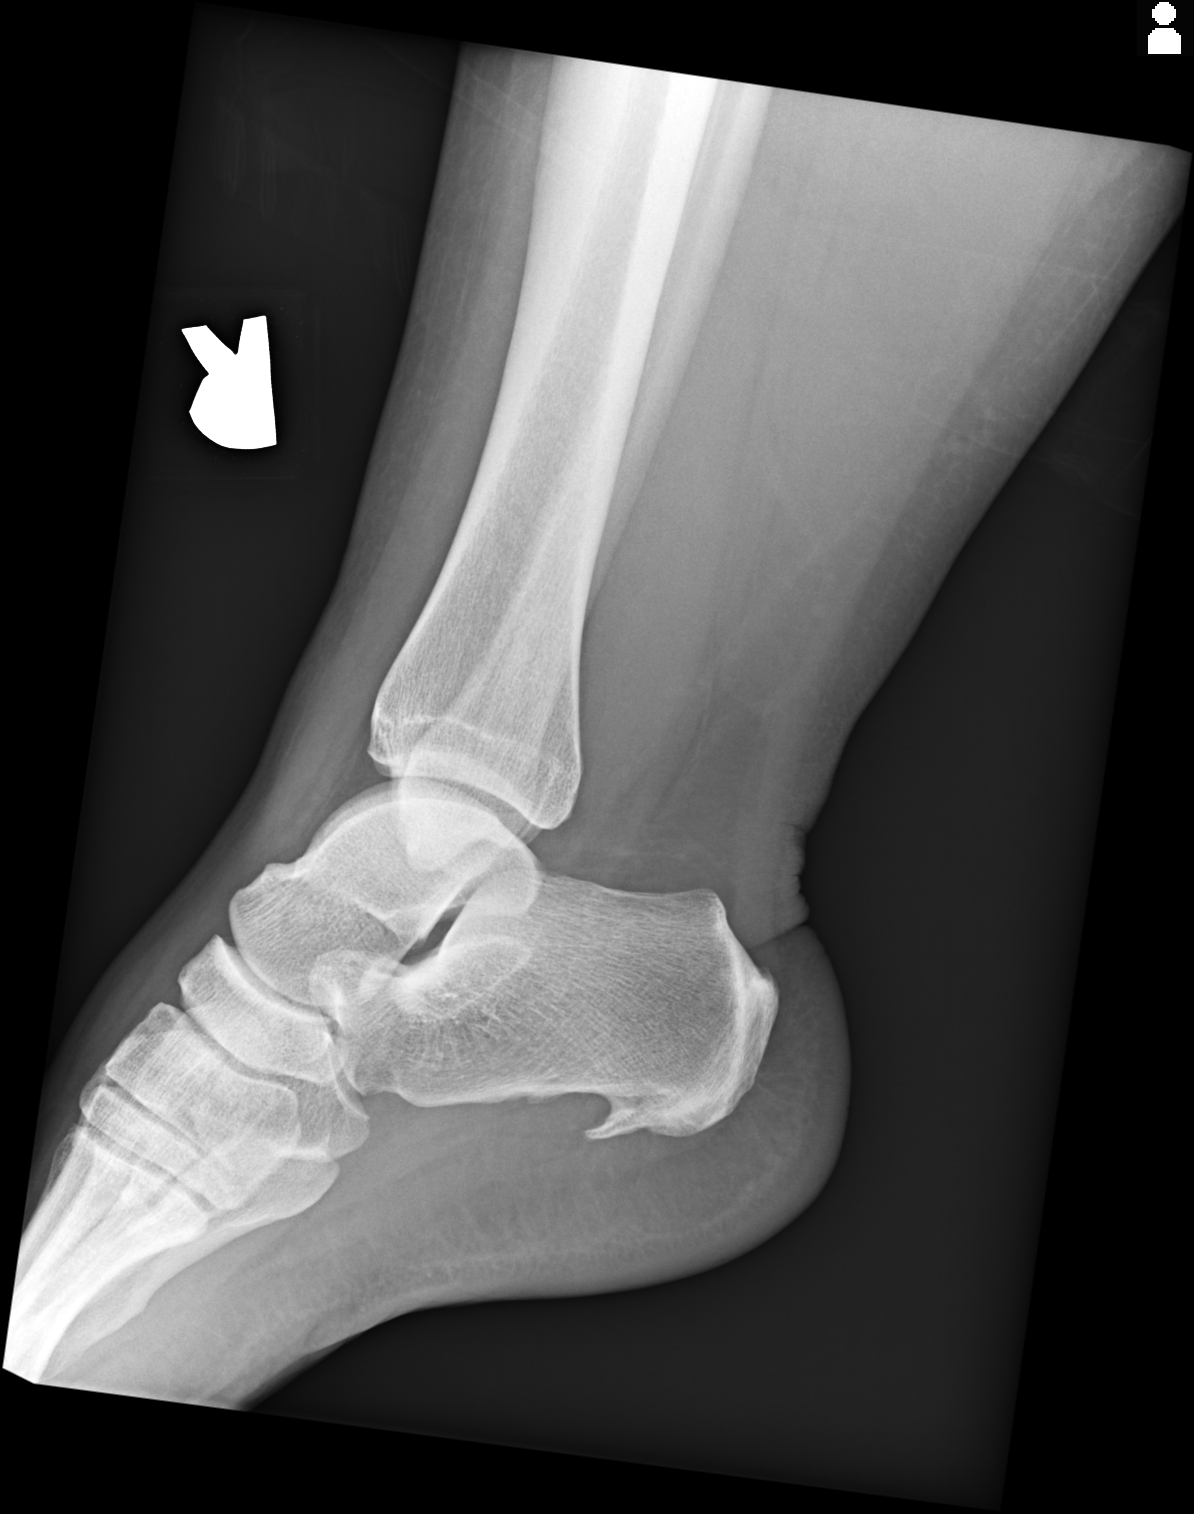

[oblique]
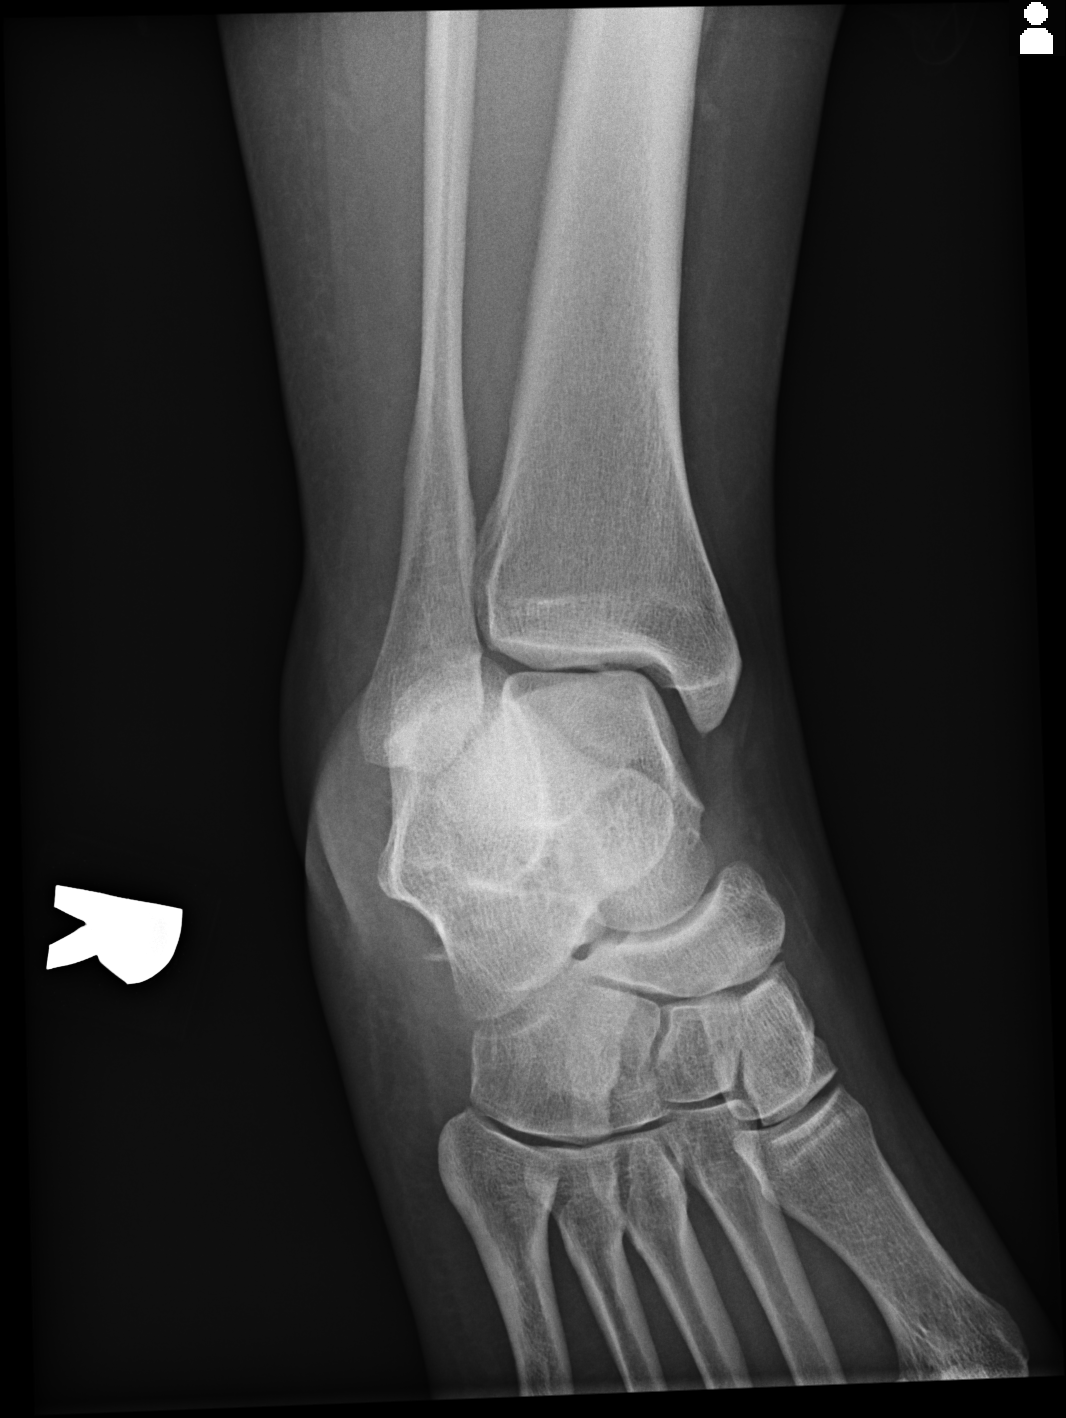

[3 of 3 positions shown; findings below may reference images not displayed]

FINDINGS: Acute avulsion fracture from the anterolateral process of the
calcaneus. Associated soft tissue edema. No additional fracture. The
ankle mortise is preserved. There is a plantar calcaneal spur. Soft
tissue edema most prominent laterally. No tibial talar joint
effusion.
IMPRESSION: Acute avulsion fracture from the anterolateral process of the
calcaneus.

## 2020-10-25 DIAGNOSIS — Z01419 Encounter for gynecological examination (general) (routine) without abnormal findings: Secondary | ICD-10-CM | POA: Diagnosis not present

## 2020-10-25 DIAGNOSIS — L680 Hirsutism: Secondary | ICD-10-CM | POA: Diagnosis not present

## 2020-10-25 DIAGNOSIS — R102 Pelvic and perineal pain: Secondary | ICD-10-CM | POA: Diagnosis not present

## 2020-10-25 DIAGNOSIS — Z113 Encounter for screening for infections with a predominantly sexual mode of transmission: Secondary | ICD-10-CM | POA: Diagnosis not present

## 2020-10-25 DIAGNOSIS — L659 Nonscarring hair loss, unspecified: Secondary | ICD-10-CM | POA: Diagnosis not present

## 2020-10-25 DIAGNOSIS — N912 Amenorrhea, unspecified: Secondary | ICD-10-CM | POA: Diagnosis not present

## 2020-11-08 DIAGNOSIS — E229 Hyperfunction of pituitary gland, unspecified: Secondary | ICD-10-CM | POA: Diagnosis not present

## 2020-11-08 DIAGNOSIS — N912 Amenorrhea, unspecified: Secondary | ICD-10-CM | POA: Diagnosis not present

## 2020-11-08 DIAGNOSIS — R102 Pelvic and perineal pain: Secondary | ICD-10-CM | POA: Diagnosis not present

## 2020-11-27 IMAGING — CR PORTABLE CHEST - 1 VIEW
1 series · 1 of 1 positions shown · non-contrast
Comparison: Chest x-ray dated 06/16/2016.

CLINICAL DATA: Chest pain and tachycardia today.

EXAM:
PORTABLE CHEST 1 VIEW

[portable]
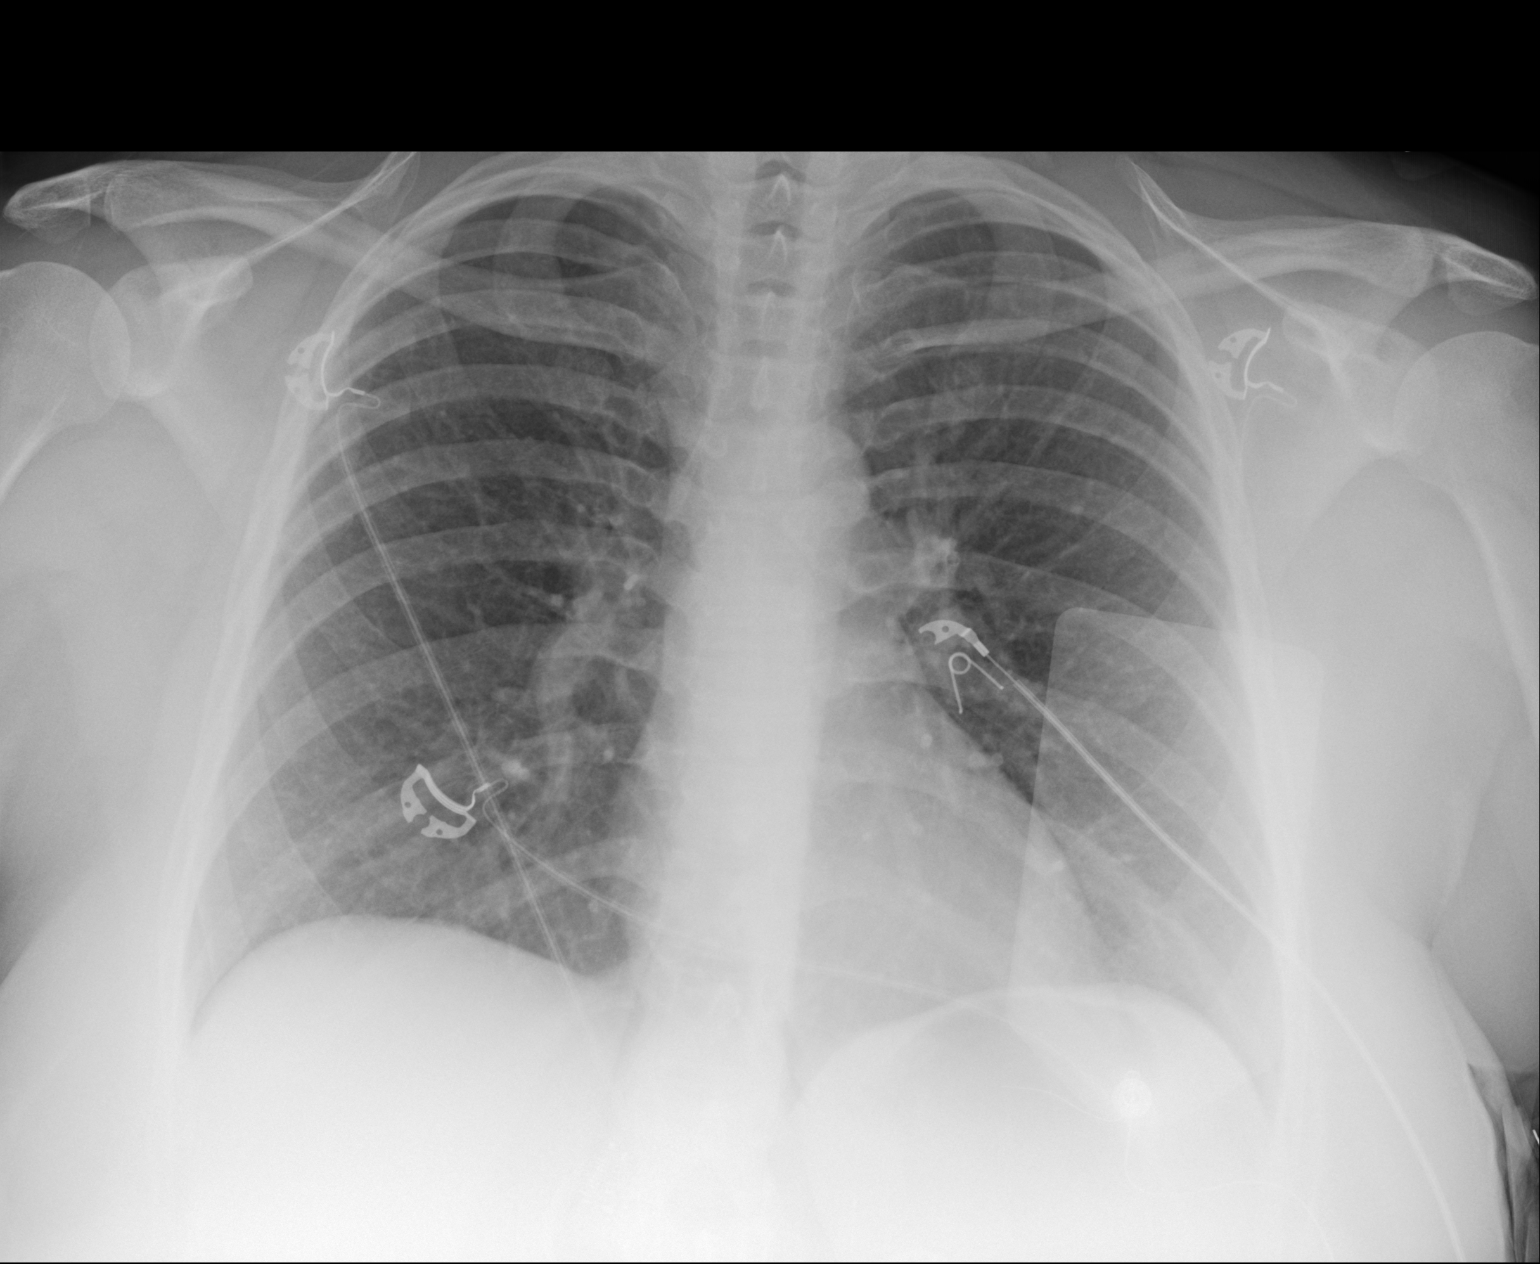

[1 of 1 positions shown; findings below may reference images not displayed]

FINDINGS: Cardiomediastinal silhouette is within normal limits in size and
configuration. Pacer pad overlies the LEFT heart border. Lungs are
clear. Lung volumes are normal. No evidence of pneumonia. No pleural
effusion. No pneumothorax seen. Osseous structures about the chest
are unremarkable.
IMPRESSION: No active disease.

## 2020-12-12 DIAGNOSIS — N941 Unspecified dyspareunia: Secondary | ICD-10-CM | POA: Diagnosis not present

## 2020-12-12 DIAGNOSIS — Z124 Encounter for screening for malignant neoplasm of cervix: Secondary | ICD-10-CM | POA: Diagnosis not present

## 2020-12-12 DIAGNOSIS — N926 Irregular menstruation, unspecified: Secondary | ICD-10-CM | POA: Diagnosis not present
